# Patient Record
Sex: Male | Born: 1955 | Race: White | Hispanic: No | Marital: Married | Smoking: Never smoker
Health system: Southern US, Community
[De-identification: ages and names within clinical notes are randomized; demographics above are authoritative.]

---

## 2007-01-10 ENCOUNTER — Encounter: Admission: RE | Admit: 2007-01-10 | Discharge: 2007-04-10 | Payer: Self-pay | Admitting: Endocrinology

## 2010-09-07 ENCOUNTER — Emergency Department (HOSPITAL_COMMUNITY): Admission: EM | Admit: 2010-09-07 | Discharge: 2010-09-07 | Payer: Self-pay | Admitting: Emergency Medicine

## 2011-02-10 LAB — POCT I-STAT, CHEM 8
Calcium, Ion: 1.08 mmol/L — ABNORMAL LOW (ref 1.12–1.32)
Chloride: 102 mEq/L (ref 96–112)
Creatinine, Ser: 1 mg/dL (ref 0.4–1.5)
Glucose, Bld: 150 mg/dL — ABNORMAL HIGH (ref 70–99)
HCT: 48 % (ref 39.0–52.0)
Hemoglobin: 16.3 g/dL (ref 13.0–17.0)

## 2020-09-05 ENCOUNTER — Emergency Department (HOSPITAL_COMMUNITY)
Admission: EM | Admit: 2020-09-05 | Discharge: 2020-09-05 | Disposition: A | Discharge status: Home / Self Care | Payer: Commercial Managed Care - PPO | Attending: Emergency Medicine | Admitting: Emergency Medicine

## 2020-09-05 ENCOUNTER — Other Ambulatory Visit: Payer: Self-pay

## 2020-09-05 ENCOUNTER — Encounter (HOSPITAL_COMMUNITY): Payer: Self-pay

## 2020-09-05 ENCOUNTER — Emergency Department (HOSPITAL_COMMUNITY): Payer: Commercial Managed Care - PPO

## 2020-09-05 DIAGNOSIS — E10649 Type 1 diabetes mellitus with hypoglycemia without coma: Secondary | ICD-10-CM | POA: Insufficient documentation

## 2020-09-05 DIAGNOSIS — R4182 Altered mental status, unspecified: Secondary | ICD-10-CM | POA: Diagnosis not present

## 2020-09-05 DIAGNOSIS — Z794 Long term (current) use of insulin: Secondary | ICD-10-CM | POA: Insufficient documentation

## 2020-09-05 DIAGNOSIS — E162 Hypoglycemia, unspecified: Secondary | ICD-10-CM

## 2020-09-05 DIAGNOSIS — R451 Restlessness and agitation: Secondary | ICD-10-CM | POA: Diagnosis not present

## 2020-09-05 LAB — CBC WITH DIFFERENTIAL/PLATELET
Abs Immature Granulocytes: 0.03 10*3/uL (ref 0.00–0.07)
Basophils Absolute: 0.1 10*3/uL (ref 0.0–0.1)
Basophils Relative: 1 %
Eosinophils Absolute: 0 10*3/uL (ref 0.0–0.5)
Eosinophils Relative: 0 %
HCT: 38.7 % — ABNORMAL LOW (ref 39.0–52.0)
Hemoglobin: 13.3 g/dL (ref 13.0–17.0)
Immature Granulocytes: 0 %
Lymphocytes Relative: 6 %
Lymphs Abs: 0.6 10*3/uL — ABNORMAL LOW (ref 0.7–4.0)
MCH: 31.2 pg (ref 26.0–34.0)
MCHC: 34.4 g/dL (ref 30.0–36.0)
MCV: 90.8 fL (ref 80.0–100.0)
Monocytes Absolute: 0.7 10*3/uL (ref 0.1–1.0)
Monocytes Relative: 7 %
Neutro Abs: 7.7 10*3/uL (ref 1.7–7.7)
Neutrophils Relative %: 86 %
Platelets: 190 10*3/uL (ref 150–400)
RBC: 4.26 MIL/uL (ref 4.22–5.81)
RDW: 12.7 % (ref 11.5–15.5)
WBC: 9.1 10*3/uL (ref 4.0–10.5)
nRBC: 0 % (ref 0.0–0.2)

## 2020-09-05 LAB — BASIC METABOLIC PANEL
Anion gap: 4 — ABNORMAL LOW (ref 5–15)
Anion gap: 4 — ABNORMAL LOW (ref 5–15)
BUN: 11 mg/dL (ref 8–23)
BUN: 10 mg/dL (ref 8–23)
CO2: 16 mmol/L — ABNORMAL LOW (ref 22–32)
CO2: 13 mmol/L — ABNORMAL LOW (ref 22–32)
Calcium: 4.4 mg/dL — CL (ref 8.9–10.3)
Calcium: <4 mg/dL — CL (ref 8.9–10.3)
Chloride: 119 mmol/L — ABNORMAL HIGH (ref 98–111)
Chloride: 121 mmol/L — ABNORMAL HIGH (ref 98–111)
Creatinine, Ser: 0.53 mg/dL — ABNORMAL LOW (ref 0.61–1.24)
Creatinine, Ser: 0.38 mg/dL — ABNORMAL LOW (ref 0.61–1.24)
GFR, Estimated: >60 mL/min (ref >60)
GFR, Estimated: >60 mL/min (ref >60)
Glucose, Bld: 140 mg/dL — ABNORMAL HIGH (ref 70–99)
Glucose, Bld: 147 mg/dL — ABNORMAL HIGH (ref 70–99)
Potassium: <2 mmol/L — CL (ref 3.5–5.1)
Potassium: <2 mmol/L — CL (ref 3.5–5.1)
Sodium: 139 mmol/L (ref 135–145)
Sodium: 138 mmol/L (ref 135–145)

## 2020-09-05 LAB — I-STAT CHEM 8, ED
BUN: 18 mg/dL (ref 8–23)
Calcium, Ion: 1.14 mmol/L — ABNORMAL LOW (ref 1.15–1.40)
Chloride: 93 mmol/L — ABNORMAL LOW (ref 98–111)
Creatinine, Ser: 1.3 mg/dL — ABNORMAL HIGH (ref 0.61–1.24)
Glucose, Bld: 261 mg/dL — ABNORMAL HIGH (ref 70–99)
HCT: 42 % (ref 39.0–52.0)
Hemoglobin: 14.3 g/dL (ref 13.0–17.0)
Potassium: 4.1 mmol/L (ref 3.5–5.1)
Sodium: 132 mmol/L — ABNORMAL LOW (ref 135–145)
TCO2: 26 mmol/L (ref 22–32)

## 2020-09-05 LAB — COMPREHENSIVE METABOLIC PANEL
ALT: 25 U/L (ref 0–44)
AST: 38 U/L (ref 15–41)
Albumin: 4.3 g/dL (ref 3.5–5.0)
Alkaline Phosphatase: 51 U/L (ref 38–126)
Anion gap: 9 (ref 5–15)
BUN: 19 mg/dL (ref 8–23)
CO2: 26 mmol/L (ref 22–32)
Calcium: 8.9 mg/dL (ref 8.9–10.3)
Chloride: 95 mmol/L — ABNORMAL LOW (ref 98–111)
Creatinine, Ser: 1.23 mg/dL (ref 0.61–1.24)
GFR, Estimated: >60 mL/min (ref >60)
Glucose, Bld: 301 mg/dL — ABNORMAL HIGH (ref 70–99)
Potassium: 4.4 mmol/L (ref 3.5–5.1)
Sodium: 130 mmol/L — ABNORMAL LOW (ref 135–145)
Total Bilirubin: 1.2 mg/dL (ref 0.3–1.2)
Total Protein: 7.1 g/dL (ref 6.5–8.1)

## 2020-09-05 LAB — CBG MONITORING, ED
Glucose-Capillary: 232 mg/dL — ABNORMAL HIGH (ref 70–99)
Glucose-Capillary: 292 mg/dL — ABNORMAL HIGH (ref 70–99)

## 2020-09-05 MED ORDER — SODIUM CHLORIDE 0.9 % IV SOLN: 250.0000 mL | INTRAVENOUS | Status: DC | PRN

## 2020-09-05 MED ORDER — SODIUM CHLORIDE 0.9% FLUSH: 3.0000 mL | INTRAVENOUS | Status: DC | PRN

## 2020-09-05 MED ORDER — POTASSIUM CHLORIDE CRYS ER 20 MEQ PO TBCR
40.0000 meq | EXTENDED_RELEASE_TABLET | Freq: Once | ORAL | Status: AC
  Administered 2020-09-05: 40 meq via ORAL
  Filled 2020-09-05: qty 2

## 2020-09-05 MED ORDER — SODIUM CHLORIDE 0.9% FLUSH: 3.0000 mL | Freq: Two times a day (BID) | INTRAVENOUS | Status: DC

## 2020-09-05 NOTE — ED Notes (Signed)
Date and time results received: 09/05/20 10:56 AM  Test: Calcium Critical Value: 4.4  Name of Provider Notified: Tegeler   Orders Received? Or Actions Taken?: Orders Received - See Orders for details

## 2020-09-05 NOTE — ED Notes (Signed)
Date and time results received: 09/05/20 10:56 AM  Test: Potassium Critical Value: >2.0  Name of Provider Notified: Tegeler   Orders Received? Or Actions Taken?: Orders Received - See Orders for details

## 2020-09-05 NOTE — ED Triage Notes (Signed)
EMS reports from home, called out for hypoglycemia, on arrival Pt. Combative and uncooperative. EMS notes LAC left rear head from prior fall out of bed this morning. Pt CBG on site 95, but after wife describing this is typical hypoglycemia reaction, given 1mg  Glucagon IM EMS states with positive response. {t ate ham sandwich made by wife prior to leaving. Pt has wearable Insulin pump right abdomen.  BP 162/86 HR 61 RR 18 Sp02 98 RA CBG 132  18 Left wrist

## 2020-09-05 NOTE — ED Provider Notes (Signed)
[facility]-EMERGENCY DEPT Provider Note   CSN: [id number] Arrival date & time: 09/05/20  0160    History Chief Complaint  Patient presents with  . Hypoglycemia    Jonathan Hamilton is a 64 y.o. male type 1 diabetic currently on insulin pump who presents for evaluation of hypoglycemia.  Patient states had episode of agitation at home.  States he has been like this previously with hypoglycemia however when EMS came out blood sugar was 92. No recent med changes. Apparently had fall out of bed this morning after he was aggitated. Wife states patient was agitated for duration of time x 1 hour. When wife told EMS he becomes combative and agitated when he becomes hypoglycemic EMS gave patient 1 mg of glucagon and patient returned to his normal mentation within 5 minutes.  Had also eaten a [place] sandwich by wife PTA to ED.  Patient denies any current complaints.  Denies headache, lightness, dizziness, chest pain, shortness breath abdominal pain, diarrhea, dysuria.  Denies use aggravating or alleviating factors.  History obtained from patient and past medical records. No interpretor was used.  Collateral from patient wife in room. Agitated earlier, consistent with his previous Hypoglycemia episodes. Did roll out of bed this morning after he became agitated. Patient did not have facial droop, weakness, numbness while he was agitated.   HPI     History reviewed. No pertinent past medical history.  There are no problems to display for this patient.   History reviewed. No pertinent surgical history.     History reviewed. No pertinent family history.  Social History   Tobacco Use  . Smoking status: Never Smoker  . Smokeless tobacco: Never Used  Substance Use Topics  . Alcohol use: Not on file  . Drug use: Not on file    Home Medications Prior to Admission medications   Medication Sig Start Date End Date Taking? Authorizing Provider  HUMALOG 100 UNIT/ML injection  Inject 0-25 Units into the skin. Insulin Pump 08/29/20  Yes [provider]  hydrochlorothiazide (HYDRODIURIL) 25 MG tablet Take 25 mg by mouth daily. 08/25/20  Yes [provider]  Multiple Vitamins-Minerals (MULTIVITAMIN ADULTS PO) Take 1 tablet by mouth daily.   Yes [provider]    Allergies    Patient has no known allergies.  Review of Systems   Review of Systems  Constitutional: Negative.   HENT: Negative.   Eyes: Negative.   Respiratory: Negative.   Cardiovascular: Negative.   Gastrointestinal: Negative.   Genitourinary: Negative.   Musculoskeletal: Negative.   Skin: Negative.   Neurological: Negative.   Psychiatric/Behavioral: Positive for agitation (Resolved).  All other systems reviewed and are negative.  Physical Exam Updated Vital Signs BP (!) 152/74   Pulse (!) 55   Temp 98.3 F (36.8 C) (Oral)   Resp 19   SpO2 98%   Physical Exam Physical Exam  Constitutional: Pt is oriented to person, place, and time. Pt appears well-developed and well-nourished. No distress.  HENT:  Head: Normocephalic and atraumatic.  Mouth/Throat: Oropharynx is clear and moist.  Eyes: Conjunctivae and EOM are normal. Pupils are equal, round, and reactive to light. No scleral icterus.  No horizontal, vertical or rotational nystagmus  Neck: Normal range of motion. Neck supple.  Full active and passive ROM without pain No midline or paraspinal tenderness No nuchal rigidity or meningeal signs  Cardiovascular: Normal rate, regular rhythm and intact distal pulses.   Pulmonary/Chest: Effort normal and breath sounds normal. No respiratory distress.  Pt has no wheezes. No rales.  Abdominal: Soft. Bowel sounds are normal. There is no tenderness. There is no rebound and no guarding. Glucose monitor to abdomen without erythema, warmth Musculoskeletal: Normal range of motion.  Lymphadenopathy:    No cervical adenopathy.  Neurological: Pt. is alert and oriented to  person, place, and time. He has normal reflexes. No cranial nerve deficit.  Exhibits normal muscle tone. Coordination normal.  Mental Status:  Alert, oriented, thought content appropriate. Speech fluent without evidence of aphasia. Able to follow 2 step commands without difficulty.  Cranial Nerves:  II:  Peripheral visual fields grossly normal, pupils equal, round, reactive to light III,IV, VI: ptosis not present, extra-ocular motions intact bilaterally  V,VII: smile symmetric, facial light touch sensation equal VIII: hearing grossly normal bilaterally  IX,X: midline uvula rise  XI: bilateral shoulder shrug equal and strong XII: midline tongue extension  Motor:  5/5 in upper and lower extremities bilaterally including strong and equal grip strength and dorsiflexion/plantar flexion Sensory: Pinprick and light touch normal in all extremities.  Deep Tendon Reflexes: 2+ and symmetric  Cerebellar: normal finger-to-nose with bilateral upper extremities Gait: normal gait and balance CV: distal pulses palpable throughout   Skin: Skin is warm and dry. No rash noted. Pt is not diaphoretic.  Psychiatric: Pt has a normal mood and affect. Behavior is normal. Judgment and thought content normal.  Nursing note and vitals reviewed. ED Results / Procedures / Treatments   Labs (all labs ordered are listed, but only abnormal results are displayed) Labs Reviewed  BASIC METABOLIC PANEL - Abnormal; Notable for the following components:      Result Value   Potassium <2.0 (*)    Chloride 119 (*)    CO2 16 (*)    Glucose, Bld 140 (*)    Creatinine, Ser 0.53 (*)    Calcium 4.4 (*)    Anion gap 4 (*)    All other components within normal limits  CBC WITH DIFFERENTIAL/PLATELET - Abnormal; Notable for the following components:   HCT 38.7 (*)    Lymphs Abs 0.6 (*)    All other components within normal limits  BASIC METABOLIC PANEL - Abnormal; Notable for the following components:   Potassium <2.0 (*)     Chloride 121 (*)    CO2 13 (*)    Glucose, Bld 147 (*)    Creatinine, Ser 0.38 (*)    Calcium <4.0 (*)    Anion gap 4 (*)    All other components within normal limits  COMPREHENSIVE METABOLIC PANEL - Abnormal; Notable for the following components:   Sodium 130 (*)    Chloride 95 (*)    Glucose, Bld 301 (*)    All other components within normal limits  CBG MONITORING, ED - Abnormal; Notable for the following components:   Glucose-Capillary 232 (*)    All other components within normal limits  CBG MONITORING, ED - Abnormal; Notable for the following components:   Glucose-Capillary 292 (*)    All other components within normal limits  I-STAT CHEM 8, ED - Abnormal; Notable for the following components:   Sodium 132 (*)    Chloride 93 (*)    Creatinine, Ser 1.30 (*)    Glucose, Bld 261 (*)    Calcium, Ion 1.14 (*)    All other components within normal limits  URINALYSIS, ROUTINE W REFLEX MICROSCOPIC  CBG MONITORING, ED    EKG EKG Interpretation  Date/Time:  Saturday September 05 2020 10:19:07 EDT Ventricular  Rate:  59 PR Interval:    QRS Duration: 93 QT Interval:  437 QTC Calculation: 433 R Axis:   47 Text Interpretation: Sinus rhythm Prolonged PR interval Probable left atrial enlargement ST elevation, consider inferior injury No prior ECG for comparison. No STEMI Confirmed by Theda Belfast ([id number]) on 09/05/2020 2:49:54 PM   Radiology DG Chest 2 View  Result Date: 09/05/2020 CLINICAL DATA:  Hypoglycemia. EXAM: CHEST - 2 VIEW COMPARISON:  None. FINDINGS: The heart size and mediastinal contours are within normal limits. Both lungs are clear. The visualized skeletal structures are unremarkable. IMPRESSION: No active cardiopulmonary disease. Electronically Signed   By: Sherian Rein M.D.   On: 09/05/2020 10:55   CT Head Wo Contrast  Result Date: 09/05/2020 CLINICAL DATA:  Delirium. EXAM: CT HEAD WITHOUT CONTRAST TECHNIQUE: Contiguous axial images were obtained from the base of  the skull through the vertex without intravenous contrast. COMPARISON:  None. FINDINGS: Brain: No evidence of acute infarction, hemorrhage, hydrocephalus, extra-axial collection or mass lesion/mass effect. Vascular: No hyperdense vessel or unexpected calcification. Skull: Normal. Negative for fracture or focal lesion. Sinuses/Orbits: No acute finding. Other: None. IMPRESSION: No focal acute intracranial abnormality identified. Electronically Signed   By: Sherian Rein M.D.   On: 09/05/2020 10:56    Procedures Procedures (including critical care time)  Medications Ordered in ED Medications  potassium chloride SA (KLOR-CON) CR tablet 40 mEq (40 mEq Oral Given 09/05/20 1349)   ED Course  I have reviewed the triage vital signs and the nursing notes.  Pertinent labs & imaging results that were available during my care of the patient were reviewed by me and considered in my medical decision making (see chart for details).  64 year old presents for evaluation of episode of altered mental status and agitation prior to arrival.  Lasted 1 hour.  He is afebrile, nonseptic, not ill-appearing.  No preceding symptoms.  Wife states his behavior was typical of his hypoglycemia episodes.  He is a type I on insulin pump.  When EMS arrived patient's blood sugar was 92.  Per wife EMS gave glucagon and within 5 minutes patient was back to his normal mentation.  He did not remember what happened.  Patient did not have any slurred speech, difficulty ambulating, facial droop during this episode.  On arrival patient ANO x4.  He denies any complaints.  Appears overall well.  Has nonfocal neuro exam without deficits.  Plan on labs, imaging and reassess.  Labs and imaging personally reviewed and interpreted:  CBG 232 on arrival CBC without leukocytosis, hemoglobin 13.3 See note below for metabolic panels DG chest without acute infiltrates, cardiomegaly [place] edema, pneumothorax EKG without STEMI, no U waves to indicate  hypokalemia, No prolonged QT interval indicate hypocalcemia   1100: Notified of patient's abnormal BMP. patient with normal mentation and no EKG changes. Plan on istat for repeat  1130: Istat with labs WNL however labs had been sitting in mini lab for 15 minutes prior to running. Attending recommends repeat labs  Given 2 very different abnormal labs plan on repeat BMP.  1230: Repeat BMP with persistent hypokalemia and hyponatremia however nursing states this was not a new stick, was pulled off a previous placed IV, similar to original BMP.  1315: Discussed with attending and patient given multiple vastly different labs with abnormality and BMP labs pulled off of the same IV site. Discussed with nursing.  We will plan on CMP with new stick.  If persistent abnormality will plan to admit.  Discussed  plan with patient.  He would prefer to hold on any IV medications until CMP has resulted.  I discussed with patient if his original labs were correct and he has significant electrolyte abnormalities these can cause severe arrhythmias including dealth.  Patient states he feels "fine" and prefers to hold off on IV replacement until CMP results.  He has no EKG changes on initial EKG.  Plan on repeat.  Discussed with nursing for straight stick to obtain these new labs.  1450: Patient reassessed.  He is eating, watching TV.  Has no complaints.  I discussed patient's repeat CMP without significant abnormality.  Patient's repeat EKG does not show any abnormalities to suggest electrolyte imbalance.  Question whether patient's abnormal BMP x2 given they were from previously established IV site given his i-STAT Chem-8 and his CMP were straight sticks and were within normal limits.  I feel patient CMP is more consistent with patient's story and exam.  I discussed with attending, Dr. Rush Landmark.  Agrees patient is stable to dc home.  Feel patient's prior BMPs are NOT correct given multiple repeat WNL. With regards to  patient's prior episode of agitation, possible altered mental status this was likely due to hypoglycemia given this was readily corrected after glucagon and blood sugars greater than 150.  Patient is continue to have a nonfocal neuro exam here in the emergency department.  I have low suspicion for acute CVA, dissection, bacterial infectious process, TIA as cause of this.  Patient appears overall well and has denied any chest pain, shortness of breath while here.  He is tolerating p.o. intake and ambulates without ataxic gait.  Discussed close follow-up with endocrinology who he sees in place of a PCP early next week for repeat labs.  Patient will return for any worsening symptoms.  Discussed plan with discharge with patient and wife in room.  They are agreeable to this.  The patient has been appropriately medically screened and/or stabilized in the ED. I have low suspicion for any other emergent medical condition which would require further screening, evaluation or treatment in the ED or require inpatient management.  Patient is hemodynamically stable and in no acute distress.  Patient able to ambulate in department prior to ED.  Evaluation does not show acute pathology that would require ongoing or additional emergent interventions while in the emergency department or further inpatient treatment.  I have discussed the diagnosis with the patient and answered all questions.  Pain is been managed while in the emergency department and patient has no further complaints prior to discharge.  Patient is comfortable with plan discussed in room and is stable for discharge at this time.  I have discussed strict return precautions for returning to the emergency department.  Patient was encouraged to follow-up with PCP/specialist refer to at discharge.   MDM Rules/Calculators/A&P                           Final Clinical Impression(s) / ED Diagnoses Final diagnoses:  Hypoglycemia    Rx / DC Orders ED Discharge  Orders    None       Kristopher Attwood A, PA-C 09/05/20 1538    Tegeler, Canary Brim, MD 09/05/20 2040

## 2020-09-05 NOTE — Discharge Instructions (Signed)
Follow-up with your endocrinologist for repeat labs  Return for any worsening symptoms.

## 2022-02-01 IMAGING — CT CT HEAD W/O CM
3 series · 15 of 47 positions shown, 18 images · non-contrast
Comparison: None.

CLINICAL DATA: Delirium.

EXAM:
CT HEAD WITHOUT CONTRAST
TECHNIQUE: Contiguous axial images were obtained from the base of the skull
through the vertex without intravenous contrast.

[Series 2: head wo · axial · 0.47mm/px · z∈[+1453,+1588]mm · 9 of 33 slices shown, 12 images]
[im 3/33  brain]
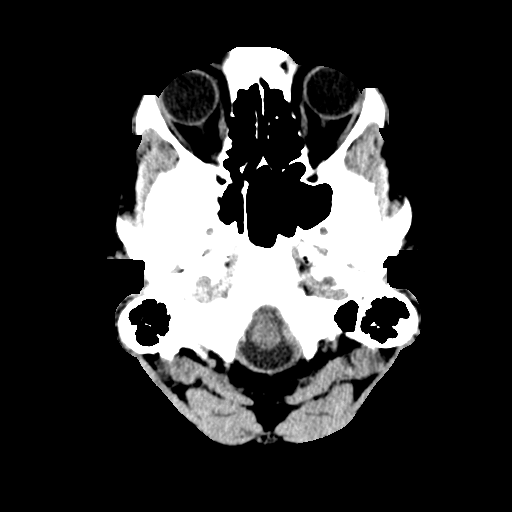
[im 3/33  bone]
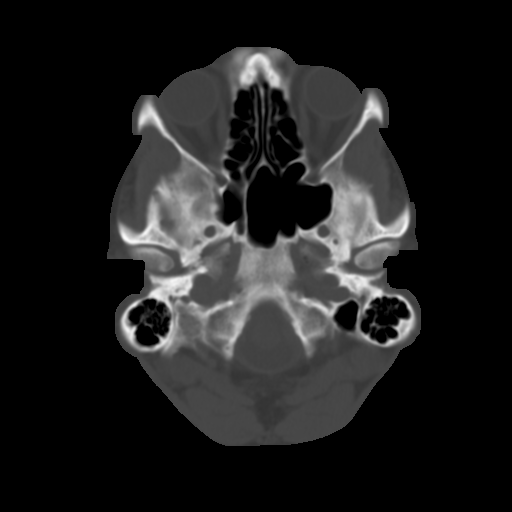
[im 6/33  brain]
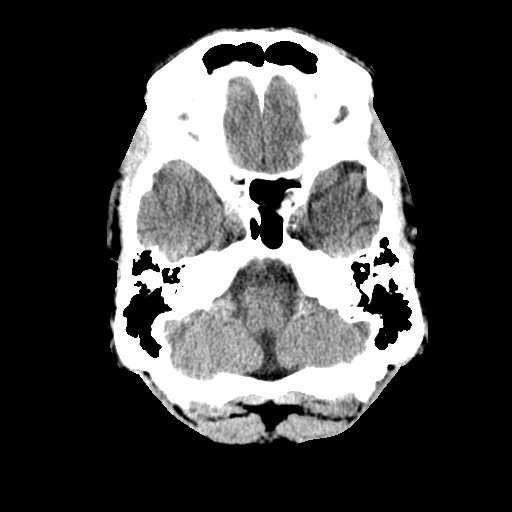
[im 9/33  brain]
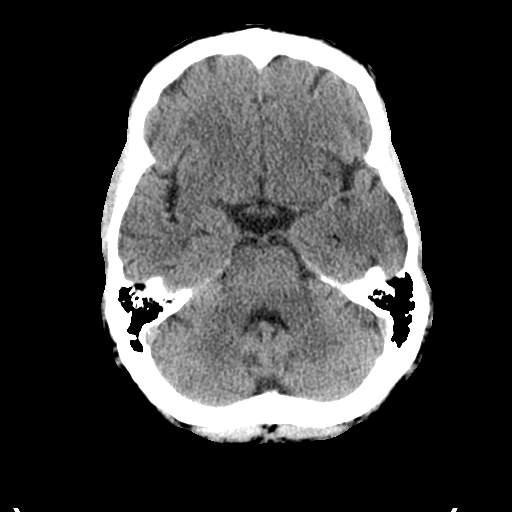
[im 13/33  brain]
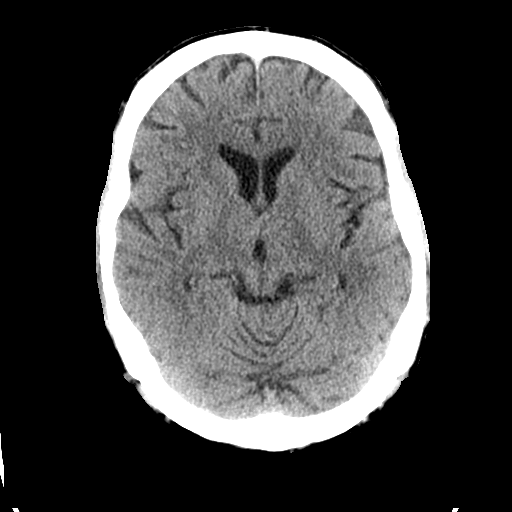
[im 17/33  brain]
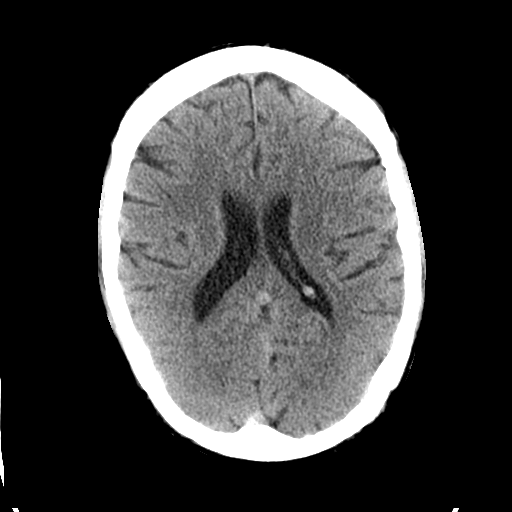
[im 17/33  bone]
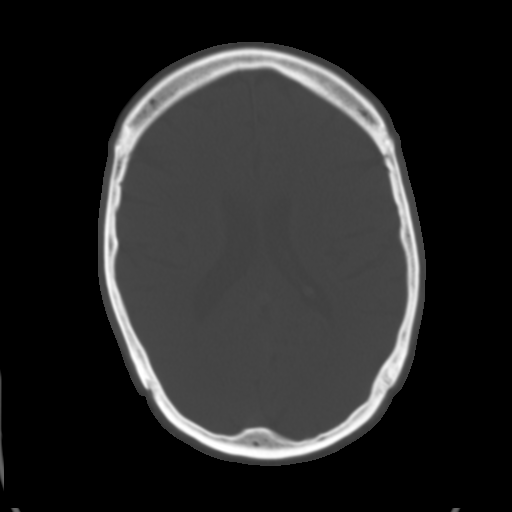
[im 20/33  brain]
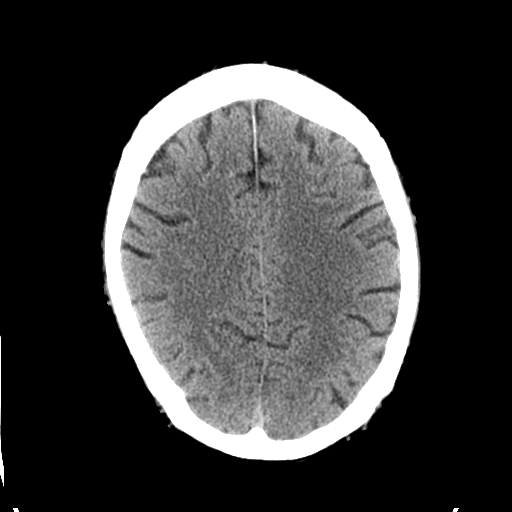
[im 24/33  brain]
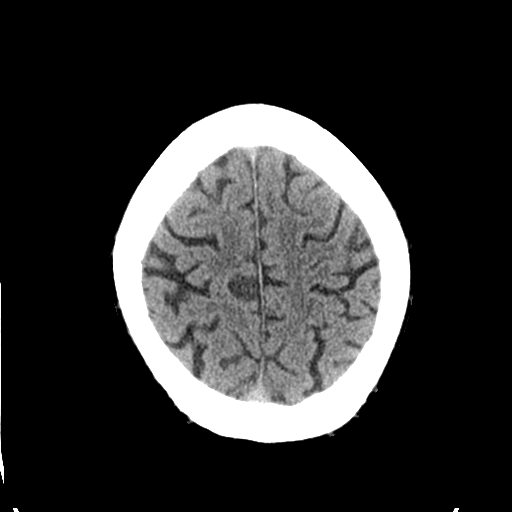
[im 27/33  brain]
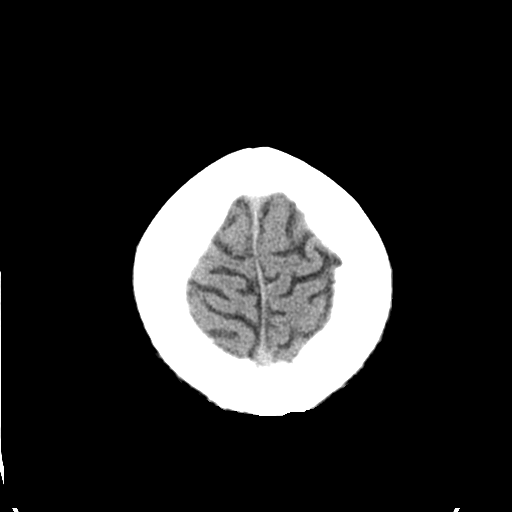
[im 30/33  brain]
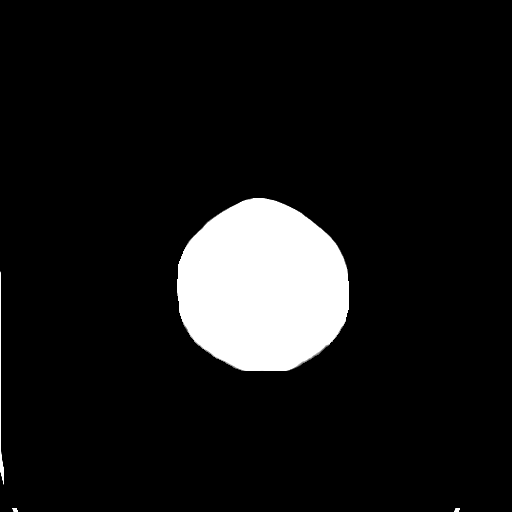
[im 30/33  bone]
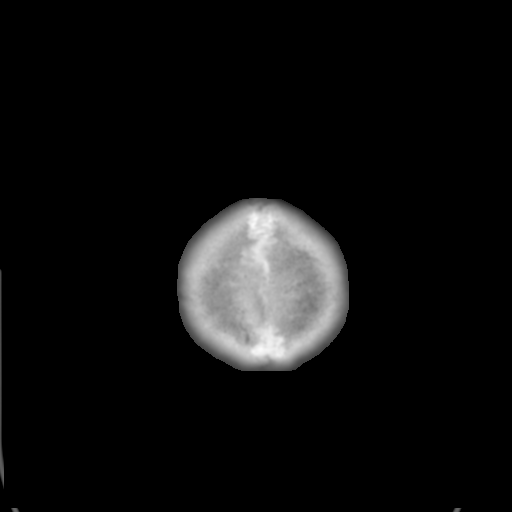

[Series 5: coronal soft tissue · coronal · 0.33mm/px · 3 of 83 slices shown]
[im 28/83  brain]
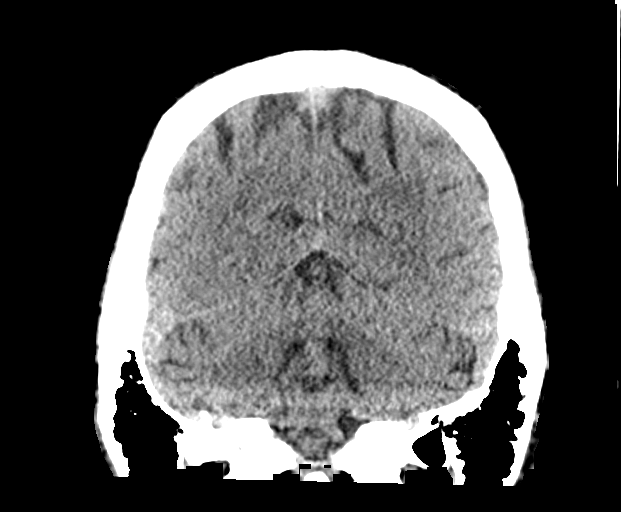
[im 37/83  brain]
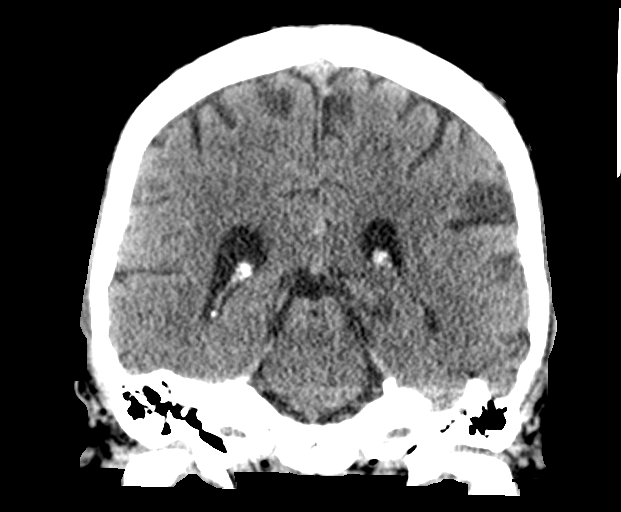
[im 46/83  brain]
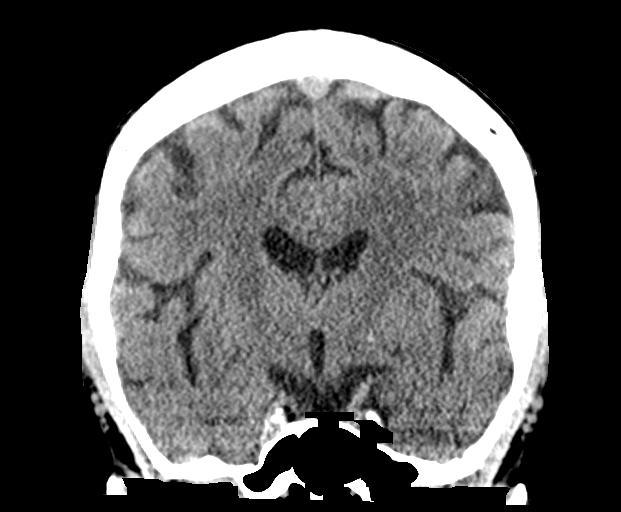

[Series 6: sagittal soft tissue · sagittal · 0.33mm/px · 3 of 68 slices shown]
[im 23/68  brain]
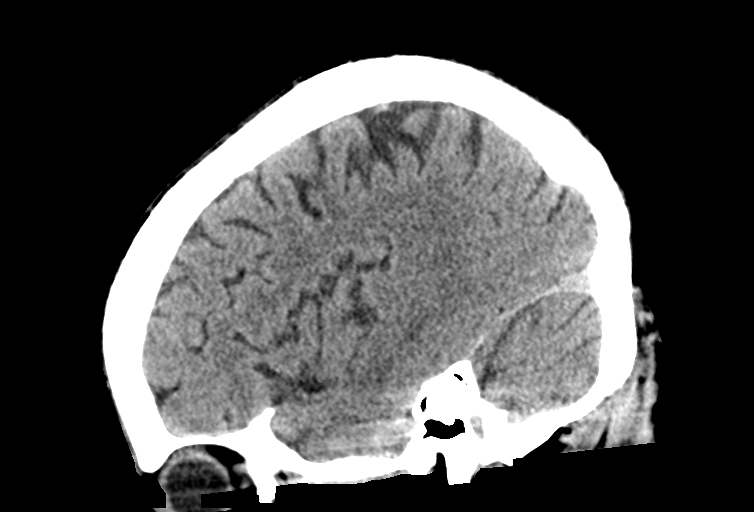
[im 34/68  brain]
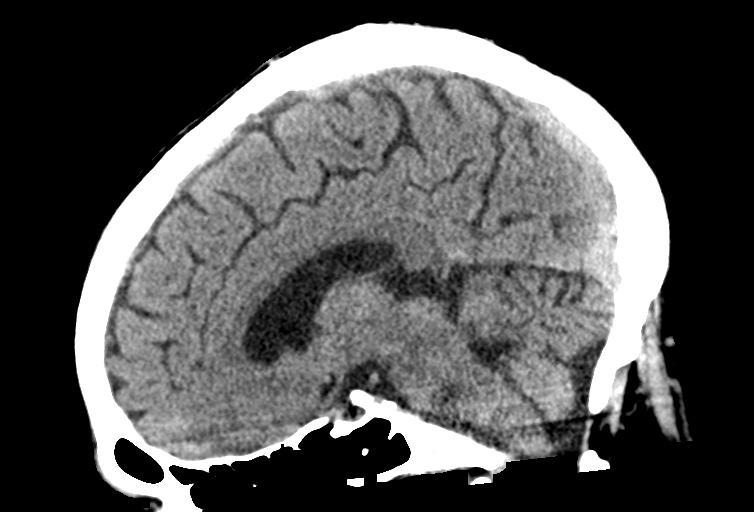
[im 45/68  brain]
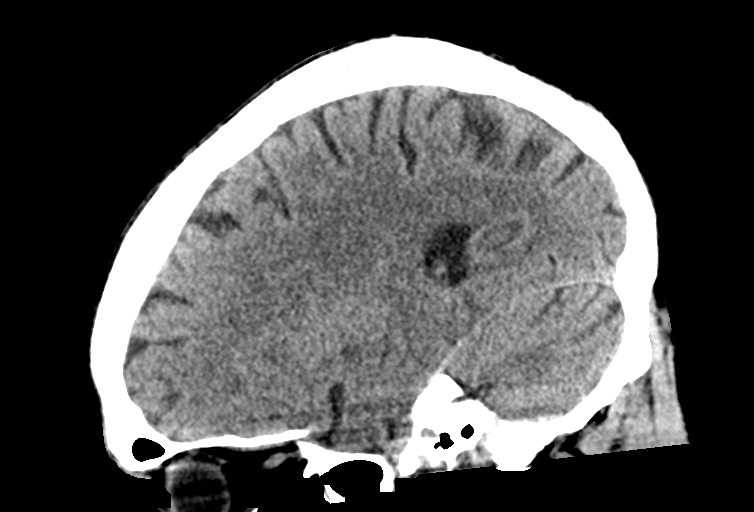

[15 of 47 positions shown; findings below may reference images not displayed]

FINDINGS: Brain: No evidence of acute infarction, hemorrhage, hydrocephalus,
extra-axial collection or mass lesion/mass effect.

Vascular: No hyperdense vessel or unexpected calcification.

Skull: Normal. Negative for fracture or focal lesion.

Sinuses/Orbits: No acute finding.

Other: None.
IMPRESSION: No focal acute intracranial abnormality identified.
